# Patient Record
Sex: Male | Born: 2000 | Race: Asian | Hispanic: No | Marital: Single | Smoking: Never smoker
Health system: Southern US, Community
[De-identification: ages and names within clinical notes are randomized; demographics above are authoritative.]

---
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
## 2019-04-27 IMAGING — DX DG CHEST 2V
2 series · 2 of 2 positions shown · non-contrast
Comparison: None

CLINICAL DATA: Cough, chest pain, epigastric pain, symptoms for 2
weeks, occasional shortness of breath

EXAM:
CHEST - 2 VIEW

[chest pa]
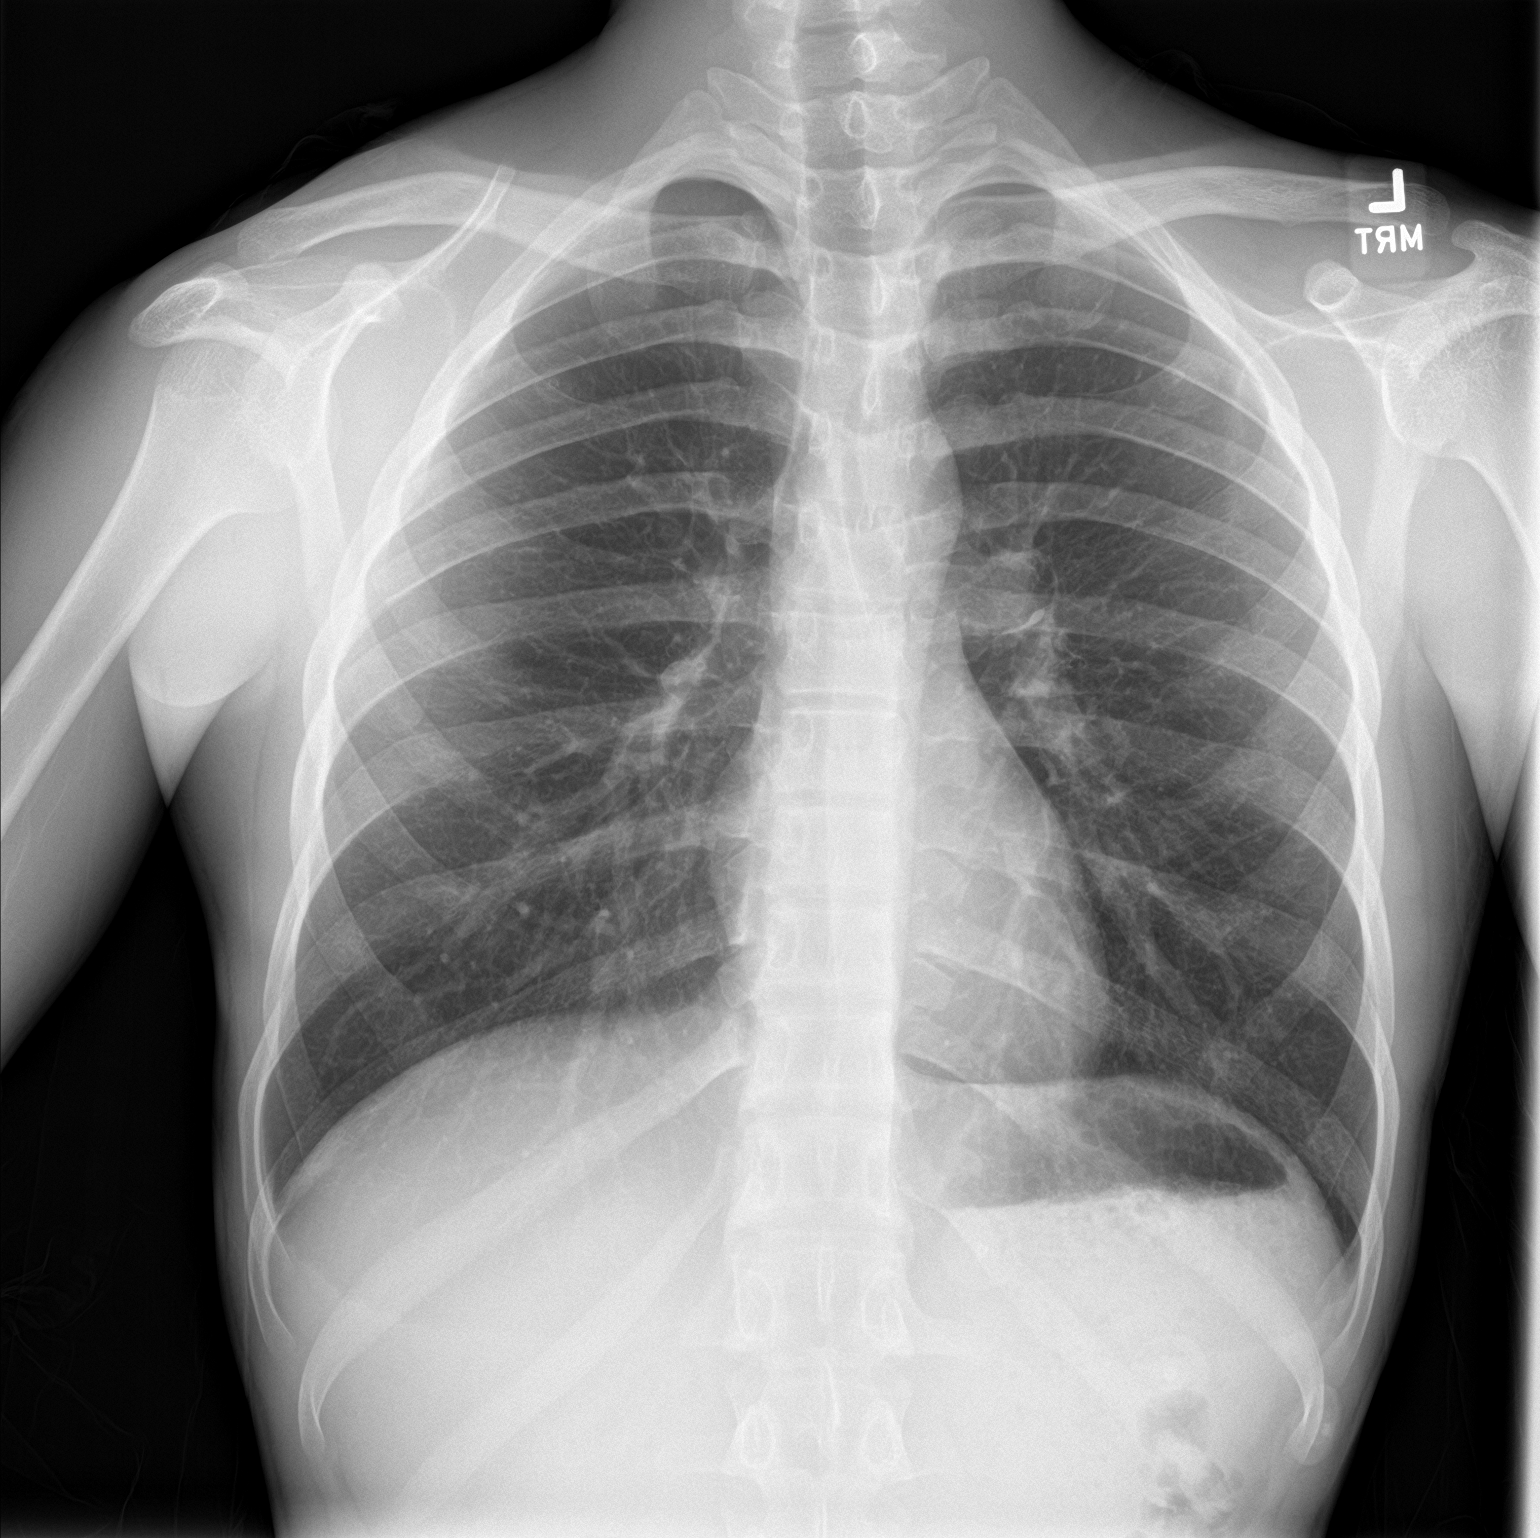

[chest lat]
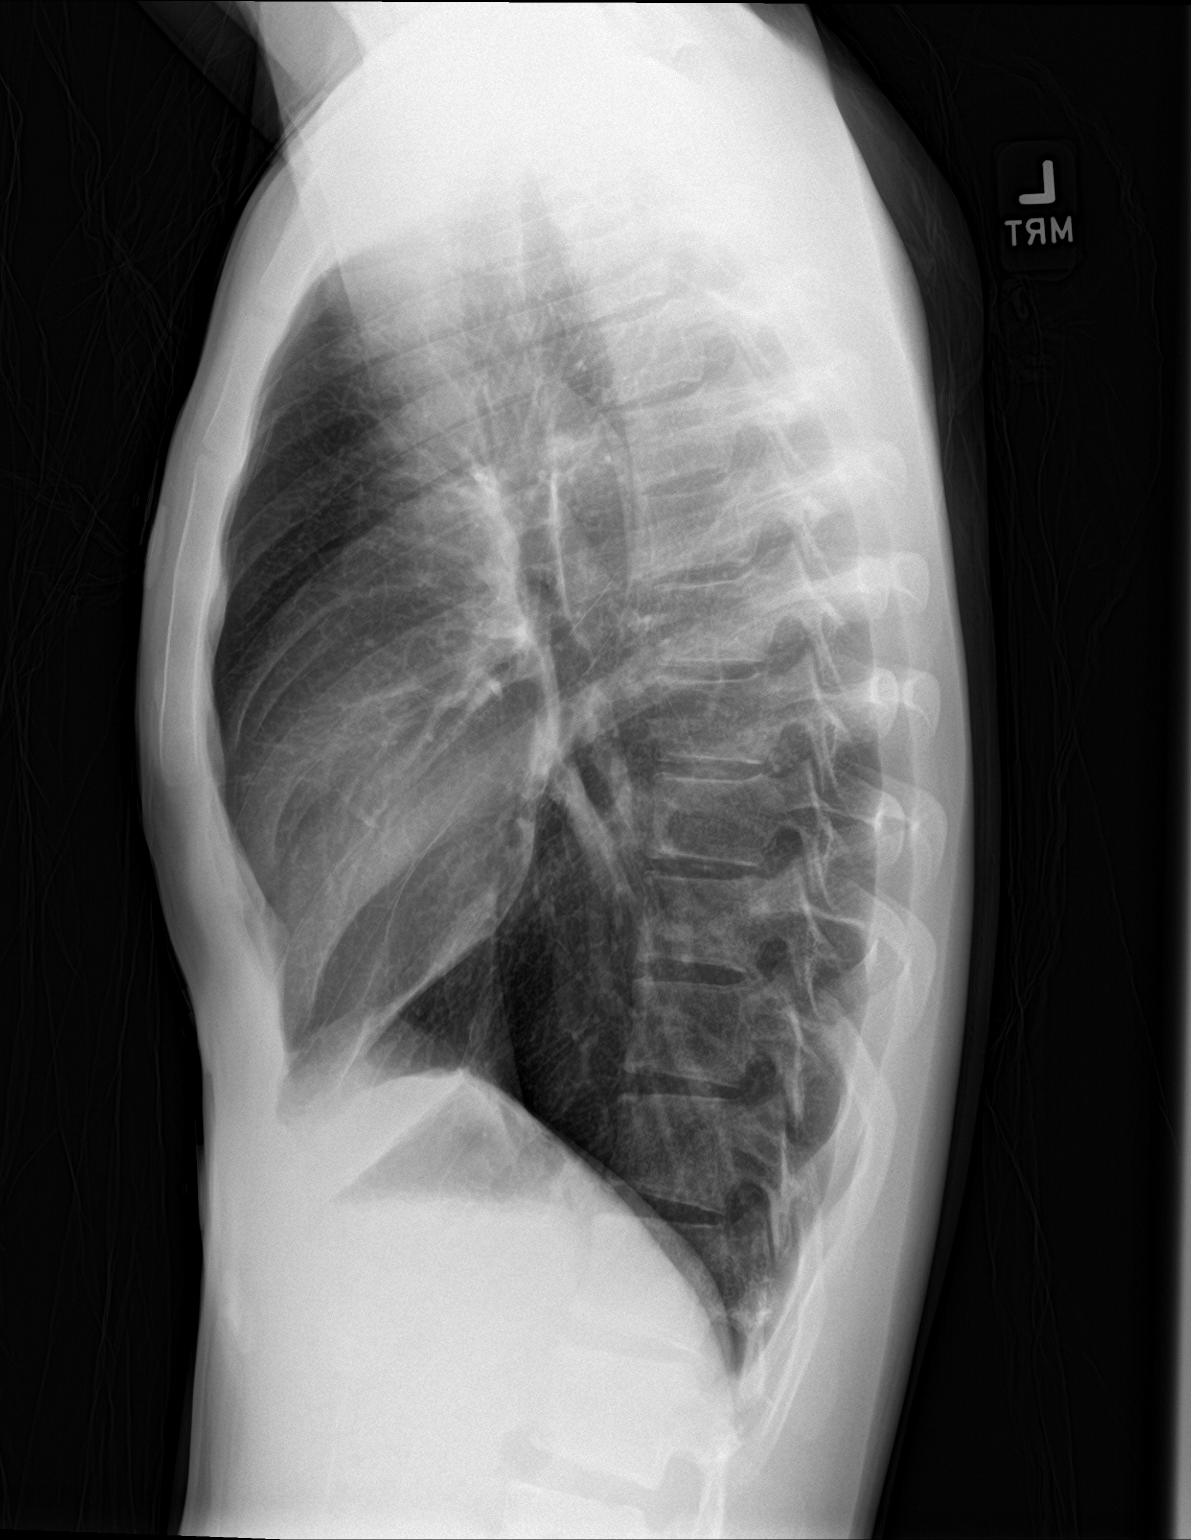

[2 of 2 positions shown; findings below may reference images not displayed]

FINDINGS: Normal heart size, mediastinal contours, and pulmonary vascularity.

Lungs mildly hyperinflated but clear.

No infiltrate, pleural effusion or pneumothorax.

Bones unremarkable.
IMPRESSION: Mildly hyperinflated lungs without acute infiltrate..

## 2021-04-21 ENCOUNTER — Other Ambulatory Visit: Payer: Self-pay

## 2021-04-21 ENCOUNTER — Encounter (HOSPITAL_COMMUNITY): Payer: Self-pay

## 2021-04-21 ENCOUNTER — Ambulatory Visit (HOSPITAL_COMMUNITY)
Admission: EM | Admit: 2021-04-21 | Discharge: 2021-04-21 | Disposition: A | Discharge status: Home / Self Care | Payer: Medicaid Other | Attending: Nurse Practitioner | Admitting: Nurse Practitioner

## 2021-04-21 DIAGNOSIS — R0981 Nasal congestion: Secondary | ICD-10-CM

## 2021-04-21 DIAGNOSIS — R040 Epistaxis: Secondary | ICD-10-CM

## 2021-04-21 MED ORDER — IPRATROPIUM BROMIDE 0.03 % NA SOLN: 2.0000 | Freq: Two times a day (BID) | NASAL | 0 refills | Status: AC

## 2021-04-21 NOTE — ED Provider Notes (Signed)
?[facility] ? ? ? ?CSN: [id number] ?Arrival date & time: 04/21/21  1759 ? ? ?  ? ?History   ?Chief Complaint ?Chief Complaint  ?Patient presents with  ? Nasal Congestion  ? Epistaxis  ? ? ?HPI ?Jesse Potter is a 21 y.o. male.  ? ?The patient is a 21 year old male who presents for nasal congestion and epistaxis x2 weeks.  He is accompanied by his girlfriend and child.  The patient states over the last 2 weeks, he has had intermittent nosebleed.  His girlfriend reports that he has had 3-4 episodes of bleeding over the past week.  She states that it "runs, and he has to lean over the trash can".  The patient states that he also notices epistaxis with blowing his nose.  He reports that at night he does develop nasal congestion.  He also states that he feels like his nose is "dry".  He denies any history of seasonal allergies, use of decongestants or any other possible mechanism.  He is not use any medication or intervention for his symptoms.  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  The patient does not take any blood thinners. ? ? ?Epistaxis ?Location:  Unable to specify ?Severity:  Moderate ?Timing:  Intermittent ?Progression:  Waxing and waning ?Context: nose picking and weather change   ?Context: not anticoagulants, not aspirin use and not recent infection   ?Associated symptoms: congestion and dizziness   ?Associated symptoms: no cough, no facial pain, no fever, no headaches, no sinus pain, no sneezing and no sore throat   ?Dizziness:  ?  Severity:  Mild ?  Progression:  Improving ?Risk factors: frequent nosebleeds   ? ?History reviewed. No pertinent past medical history. ? ?Patient Active Problem List  ? Diagnosis Date Noted  ? Refugee health examination 06/07/2015  ? ? ?History reviewed. No pertinent surgical history. ? ? ? ? ?Home Medications   ? ?Prior to Admission medications   ?Medication Sig Start  Date End Date Taking? Authorizing Provider  ?ipratropium (ATROVENT) 0.03 % nasal spray Place 2 sprays into both nostrils every 12 (twelve) hours for 14 days. 04/21/21 05/05/21 Yes Chriss Mannan-Warren, Sadie Haber, NP  ?fluticasone (FLONASE) 50 MCG/ACT nasal spray Place 2 sprays into both nostrils daily. 02/15/18   Cathie Hoops, Amy V, PA-C  ?ibuprofen (ADVIL,MOTRIN) 800 MG tablet Take 1 tablet (800 mg total) by mouth 3 (three) times daily. 02/15/18   Belinda Fisher, PA-C  ? ? ?Family History ?History reviewed. No pertinent family history. ? ?Social History ?Social History  ? ?Tobacco Use  ? Smoking status: Never  ? Smokeless tobacco: Never  ?Vaping Use  ? Vaping Use: Never used  ?Substance Use Topics  ? Alcohol use: Never  ?  Alcohol/week: 0.0 standard drinks  ? Drug use: Never  ? ? ? ?Allergies   ?Patient has no known allergies. ? ? ?Review of Systems ?Review of Systems  ?Constitutional: Negative.  Negative for fever.  ?HENT:  Positive for congestion and nosebleeds. Negative for ear pain, postnasal drip, sinus pain, sneezing and sore throat.   ?Respiratory: Negative.  Negative for cough.   ?Cardiovascular: Negative.   ?Skin: Negative.   ?Neurological:  Positive for dizziness. Negative for headaches.  ?Psychiatric/Behavioral: Negative.    ? ? ?Physical Exam ?Triage Vital Signs ?ED Triage Vitals  ?Enc Vitals Group  ?   BP 04/21/21 1821 114/69  ?  Pulse Rate 04/21/21 1821 98  ?   Resp 04/21/21 1821 18  ?   Temp 04/21/21 1821 99.3 ?F (37.4 ?C)  ?   Temp Source 04/21/21 1821 Oral  ?   SpO2 04/21/21 1821 100 %  ?   Weight --   ?   Height --   ?   Head Circumference --   ?   Peak Flow --   ?   Pain Score 04/21/21 1820 1  ?   Pain Loc --   ?   Pain Edu? --   ?   Excl. in GC? --   ? ?No data found. ? ?Updated Vital Signs ?BP 114/69 (BP Location: Left Arm)   Pulse 98   Temp 99.3 ?F (37.4 ?C) (Oral)   Resp 18   SpO2 100%  ? ?Visual Acuity ?Right Eye Distance:   ?Left Eye Distance:   ?Bilateral Distance:   ? ?Right Eye Near:   ?Left Eye Near:     ?Bilateral Near:    ? ?Physical Exam ?Constitutional:   ?   General: He is not in acute distress. ?   Appearance: Normal appearance.  ?HENT:  ?   Head: Normocephalic and atraumatic.  ?   Right Ear: Tympanic membrane, ear canal and external ear normal.  ?   Left Ear: Tympanic membrane, ear canal and external ear normal.  ?   Nose: Mucosal edema and congestion present. No nasal deformity, nasal tenderness or rhinorrhea.  ?   Right Turbinates: Enlarged and swollen.  ?   Left Turbinates: Enlarged and swollen.  ?   Comments: Dried blood noted to the right turbinate. ?   Mouth/Throat:  ?   Mouth: Mucous membranes are moist.  ?Eyes:  ?   Extraocular Movements: Extraocular movements intact.  ?   Conjunctiva/sclera: Conjunctivae normal.  ?   Pupils: Pupils are equal, round, and reactive to light.  ?Cardiovascular:  ?   Rate and Rhythm: Normal rate and regular rhythm.  ?   Pulses: Normal pulses.  ?   Heart sounds: Normal heart sounds.  ?Pulmonary:  ?   Effort: Pulmonary effort is normal.  ?   Breath sounds: Normal breath sounds.  ?Musculoskeletal:  ?   Cervical back: Normal range of motion.  ?Skin: ?   General: Skin is warm and dry.  ?Neurological:  ?   Mental Status: He is alert and oriented to person, place, and time.  ?Psychiatric:     ?   Mood and Affect: Mood normal.     ?   Behavior: Behavior normal.  ? ? ? ?UC Treatments / Results  ?Labs ?(all labs ordered are listed, but only abnormal results are displayed) ?Labs Reviewed - No data to display ? ?EKG ? ? ?Radiology ?No results found. ? ?Procedures ?Procedures (including critical care time) ? ?Medications Ordered in UC ?Medications - No data to display ? ?Initial Impression / Assessment and Plan / UC Course  ?I have reviewed the triage vital signs and the nursing notes. ? ?Pertinent labs & imaging results that were available during my care of the patient were reviewed by me and considered in my medical decision making (see chart for details). ? ?Patient presents with  epistaxis and nasal congestion for the past 2 weeks. Symptoms have become more frequent since the onset. No bleeding present during his visit today. Complains of nasal congestion with symptom onset. Difficult to determine cause and location of nosebleeds at this time. Will provide medication for  nasal congestion and symptomatic treatment for the nosebleeds to include Ayrs Saline Gel and Afrin as needed. Recommend use of humidifier when at home. Avoid forceful nose blowing and do not pick at the nasal mucosa. Patient instructed to follow-up if symptoms do not improve, will consider referral to ENT at that time. ?Final Clinical Impressions(s) / UC Diagnoses  ? ?Final diagnoses:  ?Epistaxis  ?Nasal congestion  ? ? ? ?Discharge Instructions   ? ?  ?Use nasal spray as prescribed.  ?It is difficult to determine the origin or cause of the bleed at this time. ?May use nasal spray provided to help with nasal congestion.  ?Purchase OTC Ayrs Saline Nasal Gel to help moisturize the nasal mucosa and turbinates. May purchase OTC Afrin nasal spray to use for uncontrolled nosebleeds. ?Try to avoid forceful blowing of the nose. Do not pick at the nose. ?Do not take aspirin while symptoms persist. ?Sleep with a humidifier in the room.  ?Follow up if symptoms do not improve. If not, will consider referral to ENT. ? ? ? ? ? ?ED Prescriptions   ? ? Medication Sig Dispense Auth. Provider  ? ipratropium (ATROVENT) 0.03 % nasal spray Place 2 sprays into both nostrils every 12 (twelve) hours for 14 days. 30 mL Hilda Rynders-Warren, Sadie Haber, NP  ? ?  ? ?PDMP not reviewed this encounter. ?  ?Abran Cantor, NP ?04/21/21 1906 ? ?

## 2021-04-21 NOTE — Discharge Instructions (Addendum)
Use nasal spray as prescribed.  ?It is difficult to determine the origin or cause of the bleed at this time. ?May use nasal spray provided to help with nasal congestion.  ?Purchase OTC Ayrs Saline Nasal Gel to help moisturize the nasal mucosa and turbinates. May purchase OTC Afrin nasal spray to use for uncontrolled nosebleeds. ?Try to avoid forceful blowing of the nose. Do not pick at the nose. ?Do not take aspirin while symptoms persist. ?Sleep with a humidifier in the room.  ?Follow up if symptoms do not improve. If not, will consider referral to ENT. ? ?

## 2021-04-21 NOTE — ED Triage Notes (Signed)
Pt complains of nasal congestion X 3 days and intermittent nosebleeds. ?
